# Patient Record
Sex: Male | Born: 1970 | Race: White | Hispanic: No | Smoking: Current every day smoker
Health system: Southern US, Community
[De-identification: ages and names within clinical notes are randomized; demographics above are authoritative.]

## PROBLEM LIST (undated history)

## (undated) DIAGNOSIS — I10 Essential (primary) hypertension: Secondary | ICD-10-CM

## (undated) DIAGNOSIS — K5190 Ulcerative colitis, unspecified, without complications: Secondary | ICD-10-CM

## (undated) DIAGNOSIS — I2510 Atherosclerotic heart disease of native coronary artery without angina pectoris: Secondary | ICD-10-CM

## (undated) DIAGNOSIS — K5010 Crohn's disease of large intestine without complications: Secondary | ICD-10-CM

## (undated) DIAGNOSIS — R569 Unspecified convulsions: Secondary | ICD-10-CM

## (undated) HISTORY — PX: JOINT REPLACEMENT: SHX530

## (undated) HISTORY — PX: APPENDECTOMY: SHX54

## (undated) HISTORY — PX: FRACTURE SURGERY: SHX138

---

## 2021-01-23 ENCOUNTER — Other Ambulatory Visit: Payer: Self-pay

## 2021-01-23 ENCOUNTER — Emergency Department: Payer: Medicare Other

## 2021-01-23 ENCOUNTER — Emergency Department
Admission: EM | Admit: 2021-01-23 | Discharge: 2021-01-24 | Disposition: A | Discharge status: Other Acute Inpatient Hospital | Payer: Medicare Other | Attending: Emergency Medicine | Admitting: Emergency Medicine

## 2021-01-23 DIAGNOSIS — J449 Chronic obstructive pulmonary disease, unspecified: Secondary | ICD-10-CM | POA: Insufficient documentation

## 2021-01-23 DIAGNOSIS — I6200 Nontraumatic subdural hemorrhage, unspecified: Secondary | ICD-10-CM | POA: Insufficient documentation

## 2021-01-23 DIAGNOSIS — S065XAA Traumatic subdural hemorrhage with loss of consciousness status unknown, initial encounter: Secondary | ICD-10-CM

## 2021-01-23 DIAGNOSIS — Z20822 Contact with and (suspected) exposure to covid-19: Secondary | ICD-10-CM | POA: Diagnosis not present

## 2021-01-23 DIAGNOSIS — R569 Unspecified convulsions: Secondary | ICD-10-CM | POA: Insufficient documentation

## 2021-01-23 DIAGNOSIS — F17210 Nicotine dependence, cigarettes, uncomplicated: Secondary | ICD-10-CM | POA: Diagnosis not present

## 2021-01-23 DIAGNOSIS — I10 Essential (primary) hypertension: Secondary | ICD-10-CM | POA: Diagnosis not present

## 2021-01-23 DIAGNOSIS — S065X9A Traumatic subdural hemorrhage with loss of consciousness of unspecified duration, initial encounter: Secondary | ICD-10-CM

## 2021-01-23 LAB — URINALYSIS, COMPLETE (UACMP) WITH MICROSCOPIC
Bacteria, UA: NONE SEEN
Bilirubin Urine: NEGATIVE
Glucose, UA: NEGATIVE mg/dL
Hgb urine dipstick: NEGATIVE
Ketones, ur: NEGATIVE mg/dL
Leukocytes,Ua: NEGATIVE
Nitrite: NEGATIVE
Protein, ur: NEGATIVE mg/dL
Specific Gravity, Urine: 1.008 (ref 1.005–1.030)
Squamous Epithelial / HPF: NONE SEEN (ref 0–5)
pH: 7 (ref 5.0–8.0)

## 2021-01-23 LAB — RESP PANEL BY RT-PCR (FLU A&B, COVID) ARPGX2
Influenza A by PCR: NEGATIVE
Influenza B by PCR: NEGATIVE
SARS Coronavirus 2 by RT PCR: NEGATIVE

## 2021-01-23 LAB — TROPONIN I (HIGH SENSITIVITY): Troponin I (High Sensitivity): 7 ng/L (ref <18)

## 2021-01-23 LAB — CBC WITH DIFFERENTIAL/PLATELET
Abs Immature Granulocytes: 0.04 10*3/uL (ref 0.00–0.07)
Basophils Absolute: 0 10*3/uL (ref 0.0–0.1)
Basophils Relative: 0 %
Eosinophils Absolute: 0.1 10*3/uL (ref 0.0–0.5)
Eosinophils Relative: 1 %
HCT: 47.9 % (ref 39.0–52.0)
Hemoglobin: 16.4 g/dL (ref 13.0–17.0)
Immature Granulocytes: 0 %
Lymphocytes Relative: 30 %
Lymphs Abs: 2.9 10*3/uL (ref 0.7–4.0)
MCH: 31.7 pg (ref 26.0–34.0)
MCHC: 34.2 g/dL (ref 30.0–36.0)
MCV: 92.6 fL (ref 80.0–100.0)
Monocytes Absolute: 0.7 10*3/uL (ref 0.1–1.0)
Monocytes Relative: 8 %
Neutro Abs: 5.8 10*3/uL (ref 1.7–7.7)
Neutrophils Relative %: 61 %
Platelets: 235 10*3/uL (ref 150–400)
RBC: 5.17 MIL/uL (ref 4.22–5.81)
RDW: 12.4 % (ref 11.5–15.5)
WBC: 9.5 10*3/uL (ref 4.0–10.5)
nRBC: 0 % (ref 0.0–0.2)

## 2021-01-23 LAB — COMPREHENSIVE METABOLIC PANEL
ALT: 16 U/L (ref 0–44)
AST: 21 U/L (ref 15–41)
Albumin: 3.8 g/dL (ref 3.5–5.0)
Alkaline Phosphatase: 59 U/L (ref 38–126)
Anion gap: 7 (ref 5–15)
BUN: 14 mg/dL (ref 6–20)
CO2: 26 mmol/L (ref 22–32)
Calcium: 8.6 mg/dL — ABNORMAL LOW (ref 8.9–10.3)
Chloride: 105 mmol/L (ref 98–111)
Creatinine, Ser: 0.91 mg/dL (ref 0.61–1.24)
GFR, Estimated: >60 mL/min (ref >60)
Glucose, Bld: 102 mg/dL — ABNORMAL HIGH (ref 70–99)
Potassium: 4.1 mmol/L (ref 3.5–5.1)
Sodium: 138 mmol/L (ref 135–145)
Total Bilirubin: 2.1 mg/dL — ABNORMAL HIGH (ref 0.3–1.2)
Total Protein: 6.9 g/dL (ref 6.5–8.1)

## 2021-01-23 LAB — PROTIME-INR
INR: 1 (ref 0.8–1.2)
Prothrombin Time: 13.2 seconds (ref 11.4–15.2)

## 2021-01-23 LAB — MAGNESIUM: Magnesium: 2 mg/dL (ref 1.7–2.4)

## 2021-01-23 LAB — APTT: aPTT: 30 seconds (ref 24–36)

## 2021-01-23 MED ORDER — LACTATED RINGERS IV BOLUS
1000.0000 mL | Freq: Once | INTRAVENOUS | Status: AC
  Administered 2021-01-23: 1000 mL via INTRAVENOUS

## 2021-01-23 MED ORDER — NICARDIPINE HCL IN NACL 20-0.86 MG/200ML-% IV SOLN
5.0000 mg/h | INTRAVENOUS | Status: DC
  Administered 2021-01-23: 5 mg/h via INTRAVENOUS
  Filled 2021-01-23: qty 200

## 2021-01-23 MED ORDER — LEVETIRACETAM IN NACL 1500 MG/100ML IV SOLN
1500.0000 mg | Freq: Once | INTRAVENOUS | Status: AC
  Administered 2021-01-23: 1500 mg via INTRAVENOUS
  Filled 2021-01-23: qty 100

## 2021-01-23 MED ORDER — FENTANYL CITRATE (PF) 100 MCG/2ML IJ SOLN
50.0000 ug | Freq: Once | INTRAMUSCULAR | Status: AC
  Administered 2021-01-23: 50 ug via INTRAVENOUS
  Filled 2021-01-23: qty 2

## 2021-01-23 NOTE — ED Notes (Signed)
[facility] to arrange transport to [facility] ED

## 2021-01-23 NOTE — ED Notes (Signed)
Melvin Pearson, powershared images to [employer]

## 2021-01-23 NOTE — ED Provider Notes (Signed)
[facility] Emergency Department Provider Note ____________________________________________   Event Date/Time   First MD Initiated Contact with Patient 01/23/21 2003     (approximate)  I have reviewed the triage vital signs and the nursing notes.  HISTORY  Chief Complaint Seizures (LKW time 1800)   HPI Melvin Pearson is a 50 y.o. malewho presents to the ED for evaluation of new onset seizure.   Chart review indicates hx HTN, HLD, GERD, COPD and continued cigarette smoking.   Patient presents to the ED from home via EMS for evaluation of new onset seizures this evening.  Patient reports an abnormal and severe headache for the past 1 week without precipitating trauma, syncope or any events.  Reports taking some Tylenol and ibuprofen with minimal improvement.  EMS was called to his home due to dysarthria and word finding difficulties this evening, they report negative stroke screen for them, but patient began seizing in front of EMS.  They describe left head and neck rotation, eyes rolled back in the head, left arm twitching.  Lasting 2-3 minutes, aborted with 2.5 mg IV Versed.  Here in the ED, patient reports diffuse global headache that is 10/10 intensity.  Reports this has never happened before.  Denies any falls, trauma or syncopal episodes.  No past medical history on file.  There are no problems to display for this patient.    Prior to Admission medications   Not on File    Allergies Lortab [hydrocodone-acetaminophen] and Morphine and related  No family history on file.  Social History    Review of Systems  Constitutional: No fever/chills Eyes: No visual changes. ENT: No sore throat. Cardiovascular: Denies chest pain. Respiratory: Denies shortness of breath. Gastrointestinal: No abdominal pain.  No nausea, no vomiting.  No diarrhea.  No constipation. Genitourinary: Negative for dysuria. Musculoskeletal: Negative for back pain. Skin:  Negative for rash. Neurological: Negative for focal weakness or numbness. Positive for headache and new onset seizure  ____________________________________________   PHYSICAL EXAM:  VITAL SIGNS: Vitals:   01/23/21 2030 01/23/21 2100  BP: (!) 166/112 (!) 157/106  Pulse: 77 89  Resp: 20 16  Temp:    SpO2: 98% 95%     Constitutional: Alert and oriented. Well appearing and in no acute distress. Eyes: Conjunctivae are normal. PERRL. EOMI. Head: Atraumatic. Nose: No congestion/rhinnorhea. Mouth/Throat: Mucous membranes are moist.  Oropharynx non-erythematous. Neck: No stridor. No cervical spine tenderness to palpation. Cardiovascular: Normal rate, regular rhythm. Grossly normal heart sounds.  Good peripheral circulation. Respiratory: Normal respiratory effort.  No retractions. Lungs CTAB. Gastrointestinal: Soft , nondistended, nontender to palpation. No CVA tenderness. Musculoskeletal: No lower extremity tenderness nor edema.  No joint effusions. No signs of acute trauma. Neurologic:  Normal speech and language. No gross focal neurologic deficits are appreciated.  Cranial nerves II through XII intact 5/5 strength and sensation in all 4 extremities Skin:  Skin is warm, dry and intact. No rash noted. Psychiatric: Mood and affect are normal. Speech and behavior are normal. ____________________________________________   LABS (all labs ordered are listed, but only abnormal results are displayed)  Labs Reviewed  COMPREHENSIVE METABOLIC PANEL - Abnormal; Notable for the following components:      Result Value   Glucose, Bld 102 (*)    Calcium 8.6 (*)    Total Bilirubin 2.1 (*)    All other components within normal limits  RESP PANEL BY RT-PCR (FLU A&B, COVID) ARPGX2  CBC WITH DIFFERENTIAL/PLATELET  MAGNESIUM  PROTIME-INR  APTT  URINALYSIS, COMPLETE (UACMP) WITH MICROSCOPIC  URINE DRUG SCREEN, QUALITATIVE ([facility] ONLY)  TROPONIN I (HIGH SENSITIVITY)    ____________________________________________  12 Lead EKG  Sinus rhythm, rate of 82 bpm.  Normal axis and intervals.  No evidence of acute ischemia. ____________________________________________  RADIOLOGY  ED MD interpretation: CT head reviewed by me with evidence of an acute SDH with 5 mm midline shift.  Official radiology report(s): CT Head Wo Contrast  Result Date: 01/23/2021 CLINICAL DATA:  New onset seizure EXAM: CT HEAD WITHOUT CONTRAST TECHNIQUE: Contiguous axial images were obtained from the base of the skull through the vertex without intravenous contrast. COMPARISON:  None. FINDINGS: Brain: Acute high-density subdural hemorrhage on the right measuring up to 10 mm in thickness. Small amount of subdural hemorrhage along the right tentorium. There is mass-effect on the right cerebral hemisphere with 5 mm midline shift to the left. There is compression of the right lateral ventricle. Negative for hydrocephalus.  Negative for acute infarct or mass. Vascular: Negative for hyperdense vessel Skull: Negative for skull fracture Sinuses/Orbits: Mild mucosal edema paranasal sinuses. Negative orbit Other: None IMPRESSION: Acute subdural hematoma on the right measuring up to 10 mm in thickness. 5 mm midline shift to the left. These results were called by telephone at the time of interpretation on 01/23/2021 at 8:22 pm to provider [facility] , who verbally acknowledged these results. Electronically Signed   By: Marlan Palau M.D.   On: 01/23/2021 20:22   DG Chest Portable 1 View  Result Date: 01/23/2021 CLINICAL DATA:  New onset seizures.  Smoker. EXAM: PORTABLE CHEST 1 VIEW COMPARISON:  None. FINDINGS: The heart size and mediastinal contours are within normal limits. Both lungs are clear. The visualized skeletal structures are unremarkable. IMPRESSION: No active disease. Electronically Signed   By: Burman Nieves M.D.   On: 01/23/2021 20:22     ____________________________________________   PROCEDURES and INTERVENTIONS  Procedure(s) performed (including Critical Care):  .1-3 Lead EKG Interpretation  Date/Time: 01/23/2021 9:06 PM Performed by: Delton Prairie, MD Authorized by: Delton Prairie, MD     Interpretation: normal     ECG rate:  84   ECG rate assessment: normal     Rhythm: sinus rhythm     Ectopy: none     Conduction: normal   .Critical Care  Date/Time: 01/23/2021 9:06 PM Performed by: Delton Prairie, MD Authorized by: Delton Prairie, MD   Critical care provider statement:    Critical care time (minutes):  45   Critical care was necessary to treat or prevent imminent or life-threatening deterioration of the following conditions:  CNS failure or compromise   Critical care was time spent personally by me on the following activities:  Discussions with consultants, evaluation of patient's response to treatment, examination of patient, ordering and performing treatments and interventions, ordering and review of laboratory studies, ordering and review of radiographic studies, pulse oximetry, re-evaluation of patient's condition, obtaining history from patient or surrogate and review of old charts  Medications  nicardipine (CARDENE) 20mg  in 0.86% saline IV infusion (0.1 mg/ml) (5 mg/hr Intravenous New Bag/Given 01/23/21 2038)  lactated ringers bolus 1,000 mL (0 mLs Intravenous Stopped 01/23/21 2111)  levETIRAcetam (KEPPRA) IVPB 1500 mg/ 100 mL premix (0 mg Intravenous Stopped 01/23/21 2111)  fentaNYL (SUBLIMAZE) injection 50 mcg (50 mcg Intravenous Given 01/23/21 2040)    ____________________________________________   MDM / ED COURSE   50 year old male on ASA 50 presents to the ED with seizure-like activity, with evidence of new and acute  subdural hematoma, requiring flight to [facility].  Hypertensive requiring Cardene, otherwise stable.  He presents neurologically intact, GCS 15 without focal deficits, just complaining of  severe headache.  No seizure activity here in the ED.  Rapid CT head obtained and demonstrates acute SDH as the likely source of the symptoms.  Provided Keppra for seizure prophylaxis and initiated Cardene drip for BP control.  Discussed the case with neurosurgery, recommends transfer to [facility] due to no ICU beds available in our hospital.  We will transfer via helicopter.  No known falls or trauma to precipitate this.  No evidence of coagulopathy.  No evidence of ACS.  Clinical Course as of 01/23/21 2112  Thu Jan 23, 2021  2021 I discuss case with Dr. Marcell Barlow who will review images and call me back. [DS]  2029 Updated patient of CT results [DS]  2110 Accepted to [facility]. ED-ED transfer via flight [DS]  2111 Titrating Cardene to 7.5 [DS]    Clinical Course User Index [DS] Delton Prairie, MD    ____________________________________________   FINAL CLINICAL IMPRESSION(S) / ED DIAGNOSES  Final diagnoses:  Seizure (HCC)  Acute subdural hematoma [facility])     ED Discharge Orders     None        Jaiah Weigel   Note:  This document was prepared using Dragon voice recognition software and may include unintentional dictation errors.    Delton Prairie, MD 01/23/21 2114

## 2021-01-23 NOTE — ED Triage Notes (Signed)
BIBA from home for AMS then a witnessed seizure from EMS.  AO4 upon arrival to this facility.  Last well known time is approximately 1800.  Dr. Katrinka Blazing to bedside.    Patient has normal speech, is able to answer all questions appropriately at this time.    Denies SOB, chest pain at this time, does endorse "rapid breathing" during the episode he experienced with the EMS.

## 2021-01-23 NOTE — ED Notes (Signed)
This RN transported patient to CT1 and back.  No acute events during transport.  Alerted Dr. Katrinka Blazing of rad tech suspicions.

## 2021-01-24 LAB — URINE DRUG SCREEN, QUALITATIVE (ARMC ONLY)
Amphetamines, Ur Screen: NOT DETECTED
Barbiturates, Ur Screen: NOT DETECTED
Benzodiazepine, Ur Scrn: POSITIVE — AB
Cannabinoid 50 Ng, Ur ~~LOC~~: POSITIVE — AB
Cocaine Metabolite,Ur ~~LOC~~: NOT DETECTED
MDMA (Ecstasy)Ur Screen: NOT DETECTED
Methadone Scn, Ur: NOT DETECTED
Opiate, Ur Screen: NOT DETECTED
Phencyclidine (PCP) Ur S: NOT DETECTED
Tricyclic, Ur Screen: NOT DETECTED

## 2021-01-29 ENCOUNTER — Other Ambulatory Visit: Payer: Self-pay | Admitting: Neurosurgery

## 2021-01-29 DIAGNOSIS — S065X9A Traumatic subdural hemorrhage with loss of consciousness of unspecified duration, initial encounter: Secondary | ICD-10-CM

## 2021-01-29 DIAGNOSIS — S065XAA Traumatic subdural hemorrhage with loss of consciousness status unknown, initial encounter: Secondary | ICD-10-CM

## 2021-01-31 ENCOUNTER — Other Ambulatory Visit: Payer: Self-pay

## 2021-01-31 ENCOUNTER — Emergency Department: Payer: Medicare Other

## 2021-01-31 ENCOUNTER — Emergency Department
Admission: EM | Admit: 2021-01-31 | Discharge: 2021-01-31 | Disposition: A | Discharge status: Other Acute Inpatient Hospital | Payer: Medicare Other | Attending: Emergency Medicine | Admitting: Emergency Medicine

## 2021-01-31 ENCOUNTER — Ambulatory Visit: Payer: Medicare Other

## 2021-01-31 DIAGNOSIS — R5383 Other fatigue: Secondary | ICD-10-CM | POA: Insufficient documentation

## 2021-01-31 DIAGNOSIS — F17210 Nicotine dependence, cigarettes, uncomplicated: Secondary | ICD-10-CM | POA: Insufficient documentation

## 2021-01-31 DIAGNOSIS — R112 Nausea with vomiting, unspecified: Secondary | ICD-10-CM | POA: Diagnosis not present

## 2021-01-31 DIAGNOSIS — R5381 Other malaise: Secondary | ICD-10-CM | POA: Diagnosis not present

## 2021-01-31 DIAGNOSIS — R42 Dizziness and giddiness: Secondary | ICD-10-CM | POA: Diagnosis not present

## 2021-01-31 DIAGNOSIS — I10 Essential (primary) hypertension: Secondary | ICD-10-CM | POA: Insufficient documentation

## 2021-01-31 DIAGNOSIS — I2510 Atherosclerotic heart disease of native coronary artery without angina pectoris: Secondary | ICD-10-CM | POA: Insufficient documentation

## 2021-01-31 DIAGNOSIS — R262 Difficulty in walking, not elsewhere classified: Secondary | ICD-10-CM | POA: Diagnosis not present

## 2021-01-31 DIAGNOSIS — S065XAA Traumatic subdural hemorrhage with loss of consciousness status unknown, initial encounter: Secondary | ICD-10-CM

## 2021-01-31 DIAGNOSIS — R519 Headache, unspecified: Secondary | ICD-10-CM | POA: Diagnosis present

## 2021-01-31 DIAGNOSIS — S065X9A Traumatic subdural hemorrhage with loss of consciousness of unspecified duration, initial encounter: Secondary | ICD-10-CM

## 2021-01-31 DIAGNOSIS — Z20822 Contact with and (suspected) exposure to covid-19: Secondary | ICD-10-CM | POA: Diagnosis not present

## 2021-01-31 DIAGNOSIS — R410 Disorientation, unspecified: Secondary | ICD-10-CM | POA: Insufficient documentation

## 2021-01-31 HISTORY — DX: Atherosclerotic heart disease of native coronary artery without angina pectoris: I25.10

## 2021-01-31 HISTORY — DX: Essential (primary) hypertension: I10

## 2021-01-31 HISTORY — DX: Ulcerative colitis, unspecified, without complications: K51.90

## 2021-01-31 HISTORY — DX: Unspecified convulsions: R56.9

## 2021-01-31 HISTORY — DX: Crohn's disease of large intestine without complications: K50.10

## 2021-01-31 LAB — CBC
HCT: 51.6 % (ref 39.0–52.0)
Hemoglobin: 18.1 g/dL — ABNORMAL HIGH (ref 13.0–17.0)
MCH: 32.2 pg (ref 26.0–34.0)
MCHC: 35.1 g/dL (ref 30.0–36.0)
MCV: 91.8 fL (ref 80.0–100.0)
Platelets: 298 10*3/uL (ref 150–400)
RBC: 5.62 MIL/uL (ref 4.22–5.81)
RDW: 11.9 % (ref 11.5–15.5)
WBC: 15 10*3/uL — ABNORMAL HIGH (ref 4.0–10.5)
nRBC: 0 % (ref 0.0–0.2)

## 2021-01-31 LAB — PROTIME-INR
INR: 1 (ref 0.8–1.2)
Prothrombin Time: 12.8 seconds (ref 11.4–15.2)

## 2021-01-31 LAB — BASIC METABOLIC PANEL
Anion gap: 8 (ref 5–15)
BUN: 16 mg/dL (ref 6–20)
CO2: 29 mmol/L (ref 22–32)
Calcium: 10.1 mg/dL (ref 8.9–10.3)
Chloride: 95 mmol/L — ABNORMAL LOW (ref 98–111)
Creatinine, Ser: 0.83 mg/dL (ref 0.61–1.24)
GFR, Estimated: >60 mL/min (ref >60)
Glucose, Bld: 124 mg/dL — ABNORMAL HIGH (ref 70–99)
Potassium: 4.4 mmol/L (ref 3.5–5.1)
Sodium: 132 mmol/L — ABNORMAL LOW (ref 135–145)

## 2021-01-31 LAB — TYPE AND SCREEN
ABO/RH(D): O POS
Antibody Screen: NEGATIVE

## 2021-01-31 LAB — CBG MONITORING, ED: Glucose-Capillary: 133 mg/dL — ABNORMAL HIGH (ref 70–99)

## 2021-01-31 LAB — RESP PANEL BY RT-PCR (FLU A&B, COVID) ARPGX2
Influenza A by PCR: NEGATIVE
Influenza B by PCR: NEGATIVE
SARS Coronavirus 2 by RT PCR: NEGATIVE

## 2021-01-31 MED ORDER — LEVETIRACETAM IN NACL 1000 MG/100ML IV SOLN
1000.0000 mg | Freq: Once | INTRAVENOUS | Status: AC
  Administered 2021-01-31: 1000 mg via INTRAVENOUS
  Filled 2021-01-31: qty 100

## 2021-01-31 MED ORDER — PANTOPRAZOLE SODIUM 40 MG IV SOLR
40.0000 mg | Freq: Once | INTRAVENOUS | Status: AC
  Administered 2021-01-31: 40 mg via INTRAVENOUS
  Filled 2021-01-31: qty 40

## 2021-01-31 MED ORDER — ONDANSETRON HCL 4 MG/2ML IJ SOLN
4.0000 mg | Freq: Once | INTRAMUSCULAR | Status: AC
  Administered 2021-01-31: 4 mg via INTRAVENOUS
  Filled 2021-01-31: qty 2

## 2021-01-31 MED ORDER — FENTANYL CITRATE (PF) 100 MCG/2ML IJ SOLN
50.0000 ug | Freq: Once | INTRAMUSCULAR | Status: AC
  Administered 2021-01-31: 50 ug via INTRAVENOUS
  Filled 2021-01-31: qty 2

## 2021-01-31 NOTE — ED Provider Notes (Signed)
[facility] Emergency Department Provider Note  ____________________________________________   Event Date/Time   First MD Initiated Contact with Patient 01/31/21 1139     (approximate)  I have reviewed the triage vital signs and the nursing notes.   HISTORY  Chief Complaint Headache and Dizziness    HPI Melvin Pearson is a 50 y.o. male with history of recent admission for subdural here with worsening headache, nausea, dizziness.  The patient states that ever since he was hospitalized and admitted to, the patient has had ongoing headaches.  Over the last week, this has been worsening, and he has had worsening nausea, fatigue, and general malaise.  He has had nausea and vomiting.  He said difficulty walking.  Patient states that headache is 10 out of 10, generalized.  He has had some confusion.  Denies recent falls.  No other complaints.  Denies blood thinner use.  He stopped his aspirin.    Past Medical History:  Diagnosis Date  . Coronary artery disease   . Crohn's colitis (HCC)   . Hypertension   . Seizures (HCC)   . Ulcerative colitis (HCC)     There are no problems to display for this patient.   Past Surgical History:  Procedure Laterality Date  . APPENDECTOMY    . FRACTURE SURGERY    . JOINT REPLACEMENT      Prior to Admission medications   Not on File    Allergies Lortab [hydrocodone-acetaminophen] and Morphine and related  No family history on file.  Social History Social History   Tobacco Use  . Smoking status: Every Day    Pack years: 0.00    Types: Cigarettes  . Smokeless tobacco: Never  Substance Use Topics  . Alcohol use: Not Currently  . Drug use: Yes    Types: Marijuana    Review of Systems  Review of Systems  Constitutional:  Positive for fatigue. Negative for chills and fever.  HENT:  Negative for sore throat.   Respiratory:  Negative for shortness of breath.   Cardiovascular:  Negative for chest pain.   Gastrointestinal:  Negative for abdominal pain.  Genitourinary:  Negative for flank pain.  Musculoskeletal:  Negative for neck pain.  Skin:  Negative for rash and wound.  Allergic/Immunologic: Negative for immunocompromised state.  Neurological:  Positive for headaches. Negative for weakness and numbness.  Hematological:  Does not bruise/bleed easily.  All other systems reviewed and are negative.   ____________________________________________  PHYSICAL EXAM:      VITAL SIGNS: ED Triage Vitals  Enc Vitals Group     BP 01/31/21 1116 (!) 140/91     Pulse Rate 01/31/21 1116 65     Resp 01/31/21 1116 18     Temp 01/31/21 1116 98.6 F (37 C)     Temp src --      SpO2 01/31/21 1116 98 %     Weight 01/31/21 1116 224 lb (101.6 kg)     Height 01/31/21 1116 6\' 3"  (1.905 m)     Head Circumference --      Peak Flow --      Pain Score 01/31/21 1121 10     Pain Loc --      Pain Edu? --      Excl. in GC? --      Physical Exam Vitals and nursing note reviewed.  Constitutional:      General: He is not in acute distress.    Appearance: He is well-developed.  HENT:  Head: Normocephalic and atraumatic.  Eyes:     Conjunctiva/sclera: Conjunctivae normal.  Cardiovascular:     Rate and Rhythm: Normal rate and regular rhythm.     Heart sounds: Normal heart sounds. No murmur heard.   No friction rub.  Pulmonary:     Effort: Pulmonary effort is normal. No respiratory distress.     Breath sounds: Normal breath sounds. No wheezing or rales.  Abdominal:     General: There is no distension.     Palpations: Abdomen is soft.     Tenderness: There is no abdominal tenderness.  Musculoskeletal:     Cervical back: Neck supple.  Skin:    General: Skin is warm.     Capillary Refill: Capillary refill takes less than 2 seconds.  Neurological:     Mental Status: He is alert and oriented to person, place, and time.     Motor: No abnormal muscle tone.     Comments: Neurological Exam:  Mental  Status: Alert and oriented to person, place, and time. Attention and concentration normal. Speech clear. Recent memory is intact. Cranial Nerves: Visual fields grossly intact. EOMI and PERRLA. No nystagmus noted. Facial sensation intact at forehead, maxillary cheek, and chin/mandible bilaterally. No facial asymmetry or weakness. Hearing grossly normal. Uvula is midline, and palate elevates symmetrically. Normal SCM and trapezius strength. Tongue midline without fasciculations. Motor: Muscle strength 5/5 in proximal and distal UE and LE bilaterally. No pronator drift. Muscle tone normal. Sensation: Intact to light touch in upper and lower extremities distally bilaterally.  Gait: Normal without ataxia. Coordination: Normal FTN bilaterally.          ____________________________________________   LABS (all labs ordered are listed, but only abnormal results are displayed)  Labs Reviewed  BASIC METABOLIC PANEL - Abnormal; Notable for the following components:      Result Value   Sodium 132 (*)    Chloride 95 (*)    Glucose, Bld 124 (*)    All other components within normal limits  CBC - Abnormal; Notable for the following components:   WBC 15.0 (*)    Hemoglobin 18.1 (*)    All other components within normal limits  CBG MONITORING, ED - Abnormal; Notable for the following components:   Glucose-Capillary 133 (*)    All other components within normal limits  URINALYSIS, COMPLETE (UACMP) WITH MICROSCOPIC    ____________________________________________  EKG: Normal sinus rhythm, ventricular rate 57.  PR 138, QRS 82, QTc 399.  No acute ST elevations or depressions.  No EKG evidence of acute ischemia or infarct. ________________________________________  RADIOLOGY All imaging, including plain films, CT scans, and ultrasounds, independently reviewed by me, and interpretations confirmed via formal radiology reads.  ED MD interpretation:   CT head: Mild interval increase in size of the  right-sided subdural  Official radiology report(s): No results found.  ____________________________________________  PROCEDURES   Procedure(s) performed (including Critical Care):  .Critical Care  Date/Time: 01/31/2021 1:48 PM Performed by: Shaune Pollack, MD Authorized by: Shaune Pollack, MD   Critical care provider statement:    Critical care time (minutes):  35   Critical care time was exclusive of:  Separately billable procedures and treating other patients and teaching time   Critical care was necessary to treat or prevent imminent or life-threatening deterioration of the following conditions:  Trauma, circulatory failure and cardiac failure   Critical care was time spent personally by me on the following activities:  Development of treatment plan with patient or surrogate, discussions  with consultants, evaluation of patient's response to treatment, examination of patient, obtaining history from patient or surrogate, ordering and performing treatments and interventions, ordering and review of laboratory studies, ordering and review of radiographic studies, pulse oximetry, re-evaluation of patient's condition and review of old charts   I assumed direction of critical care for this patient from another provider in my specialty: no    ____________________________________________  INITIAL IMPRESSION / MDM / ASSESSMENT AND PLAN / ED COURSE  As part of my medical decision making, I reviewed the following data within the electronic MEDICAL RECORD NUMBER Nursing notes reviewed and incorporated, Old chart reviewed, Notes from prior ED visits, and [place] Controlled Substance Database       *Melvin Pearson was evaluated in Emergency Department on 01/31/2021 for the symptoms described in the history of present illness. He was evaluated in the context of the global COVID-19 pandemic, which necessitated consideration that the patient might be at risk for infection with the SARS-CoV-2 virus that causes  COVID-19. Institutional protocols and algorithms that pertain to the evaluation of patients at risk for COVID-19 are in a state of rapid change based on information released by regulatory bodies including the CDC and federal and state organizations. These policies and algorithms were followed during the patient's care in the ED.  Some ED evaluations and interventions may be delayed as a result of limited staffing during the pandemic.*     Medical Decision Making: 50 year old male here with ongoing, worsening headache, dizziness, in setting of recent subdural.  CT head obtained on arrival, reviewed by me, and is concerning for worsening hemorrhage with an acute component.  There is 7 mm as opposed to 5 mm midline shift.  He is neurologically at baseline.  He is on blood thinners.  Blood pressure is at goal.  Case discussed with Dr. Marcell Barlow, will transfer urgently to [facility] for operative intervention.  Patient updated and in agreement.  He is NPO.  Coags normal.  CBC without anemia or platelet abnormality.  Electrolytes largely normal.  ____________________________________________  FINAL CLINICAL IMPRESSION(S) / ED DIAGNOSES  Final diagnoses:  None     MEDICATIONS GIVEN DURING THIS VISIT:  Medications - No data to display   ED Discharge Orders     None        Note:  This document was prepared using Dragon voice recognition software and may include unintentional dictation errors.   Shaune Pollack, MD 01/31/21 1348

## 2021-01-31 NOTE — ED Triage Notes (Signed)
Pt c/o HA for the past 2 weeks and having dizziness over the past week, pt was seen here 6/16 with seizure with no prior hx.. pt is a/ox4. Denies any recent injury.Marland Kitchen

## 2021-01-31 NOTE — ED Notes (Signed)
EMTALA reviewed by this RN.  

## 2021-02-03 ENCOUNTER — Ambulatory Visit: Payer: Medicare Other

## 2022-06-21 IMAGING — DX DG CHEST 1V PORT
1 series · 1 of 1 positions shown · non-contrast
Comparison: None.

CLINICAL DATA: New onset seizures.  Smoker.

EXAM:
PORTABLE CHEST 1 VIEW

[chest ap]
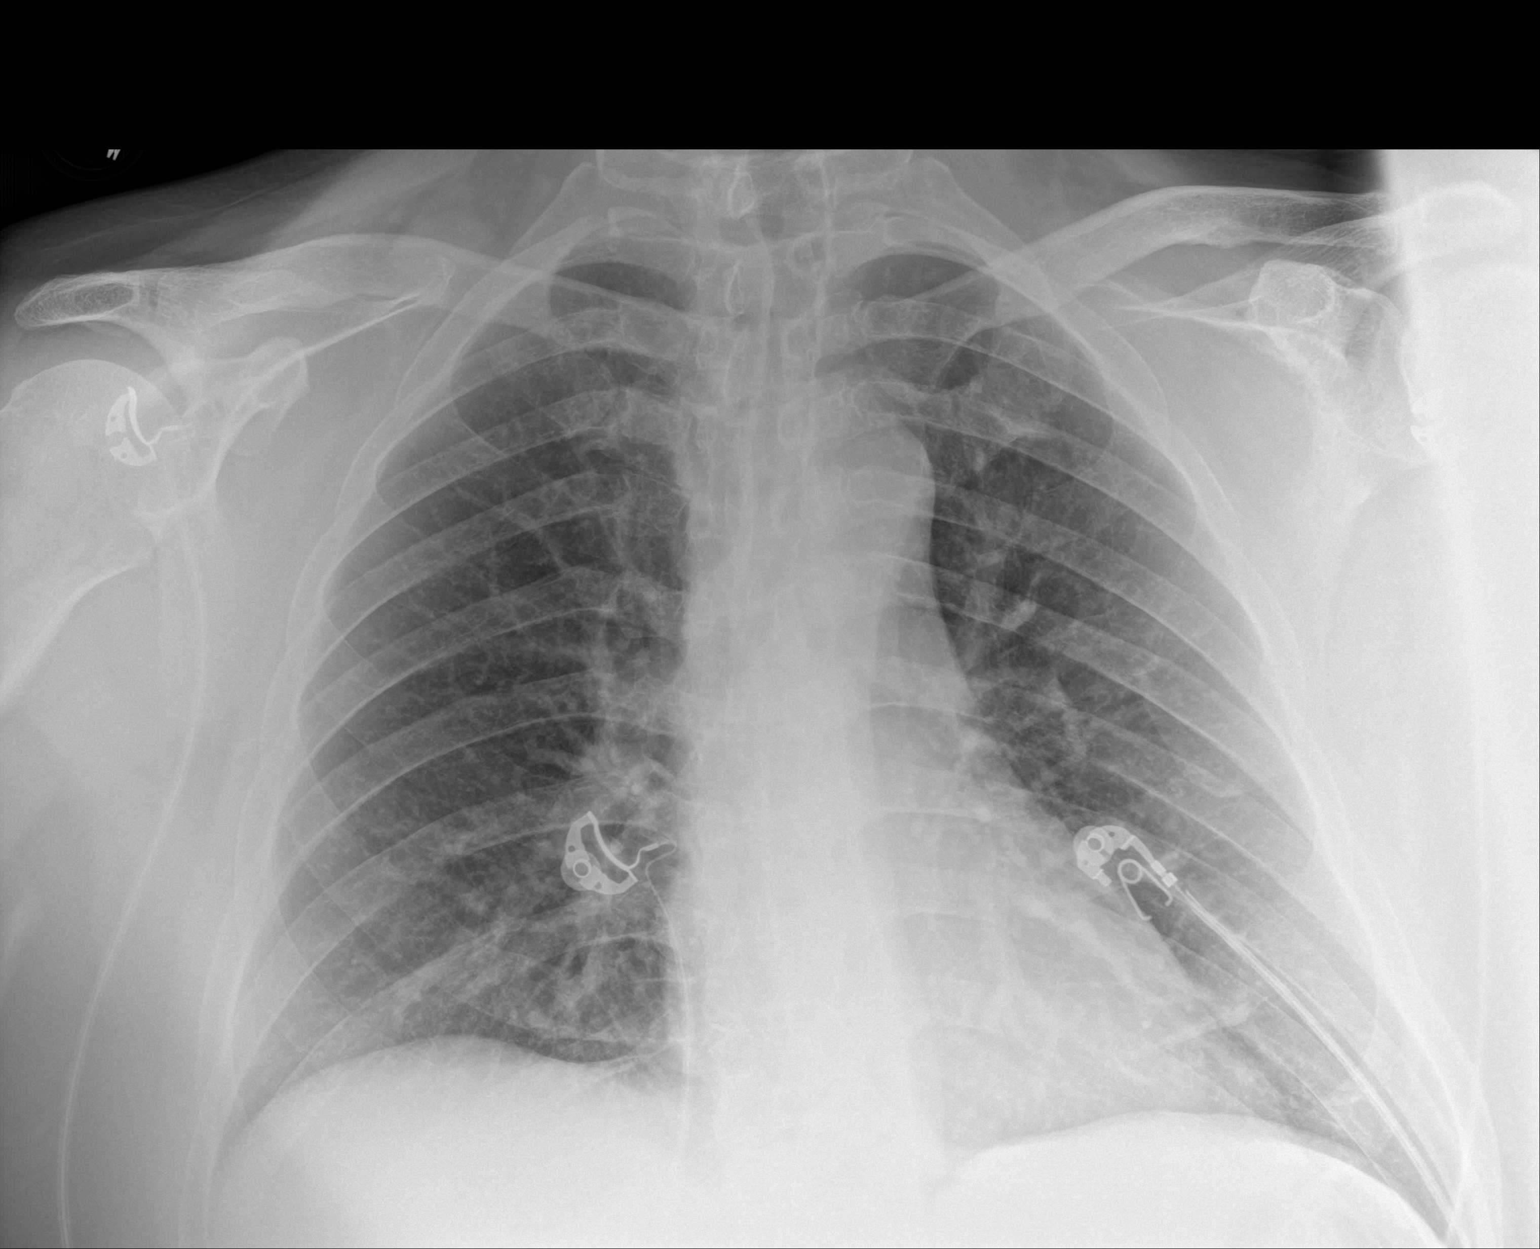

[1 of 1 positions shown; findings below may reference images not displayed]

FINDINGS: The heart size and mediastinal contours are within normal limits.
Both lungs are clear. The visualized skeletal structures are
unremarkable.
IMPRESSION: No active disease.

## 2022-06-21 IMAGING — CT CT HEAD W/O CM
3 of 4 series · 15 of 47 positions shown, 18 images · non-contrast
Comparison: None.

CLINICAL DATA: New onset seizure

EXAM:
CT HEAD WITHOUT CONTRAST
TECHNIQUE: Contiguous axial images were obtained from the base of the skull
through the vertex without intravenous contrast.

[Series 2: head wo · axial · 0.46mm/px · z∈[-153,-28]mm · 9 of 31 slices shown, 12 images]
[im 3/31  brain]
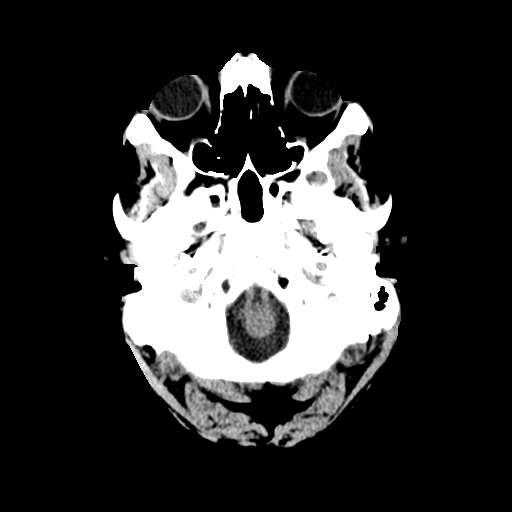
[im 3/31  bone]
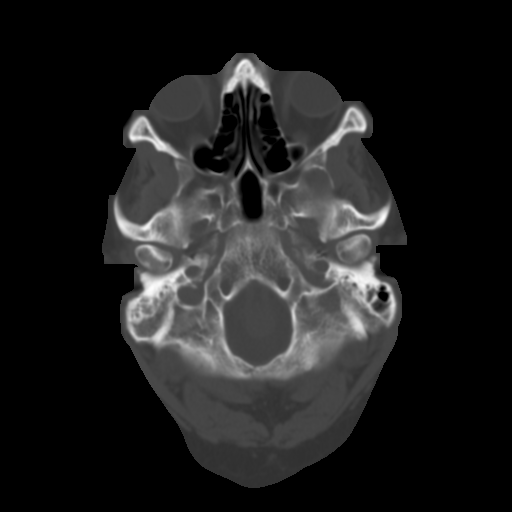
[im 7/31  brain]
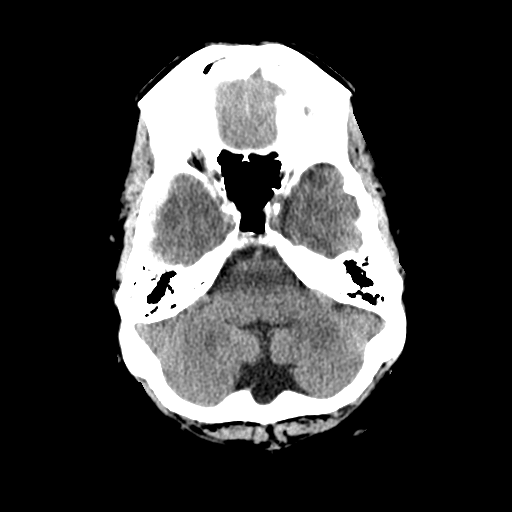
[im 9/31  brain]
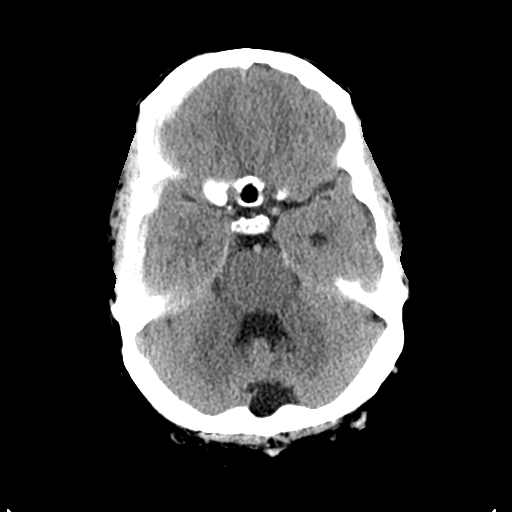
[im 13/31  brain]
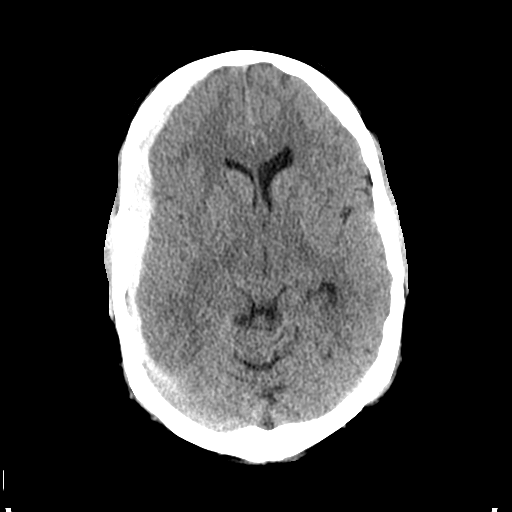
[im 16/31  brain]
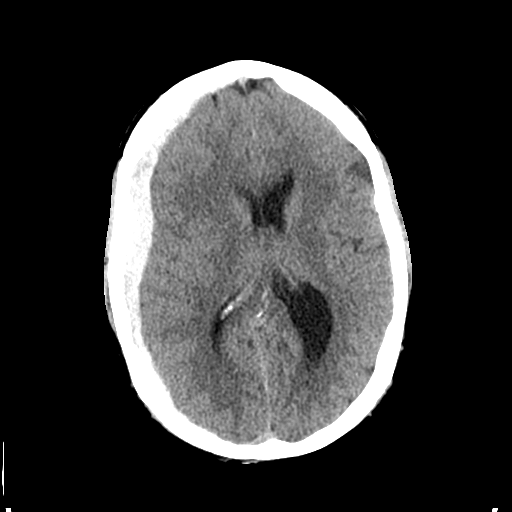
[im 16/31  bone]
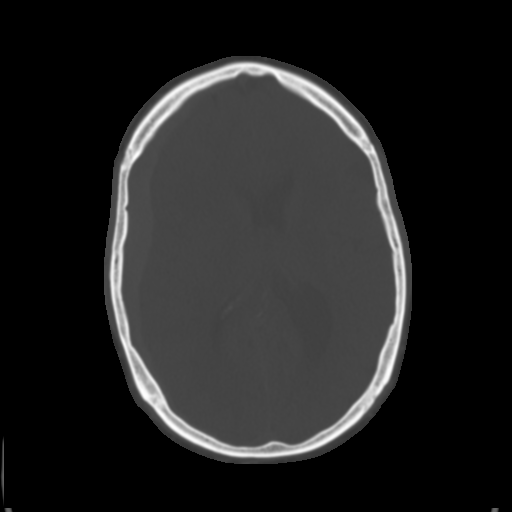
[im 18/31  brain]
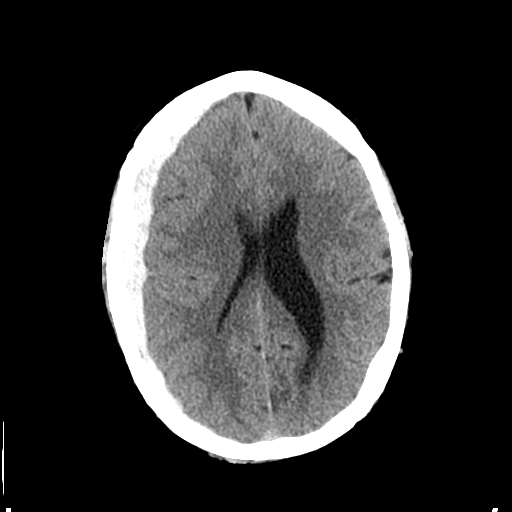
[im 22/31  brain]
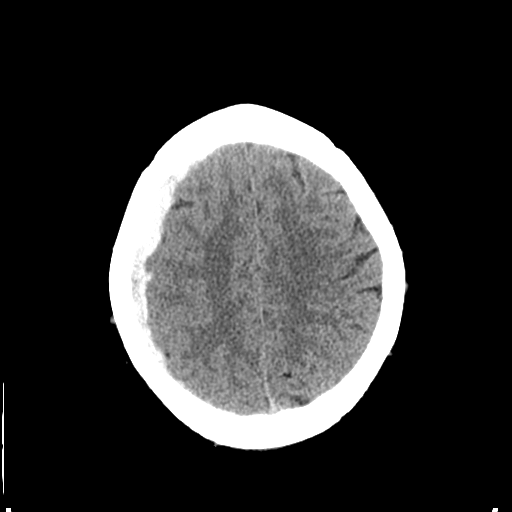
[im 24/31  brain]
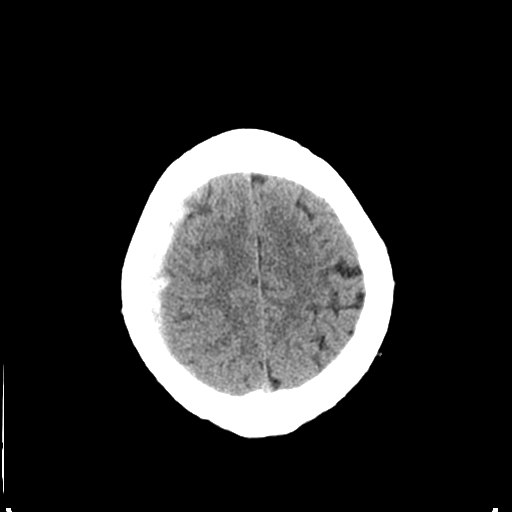
[im 28/31  brain]
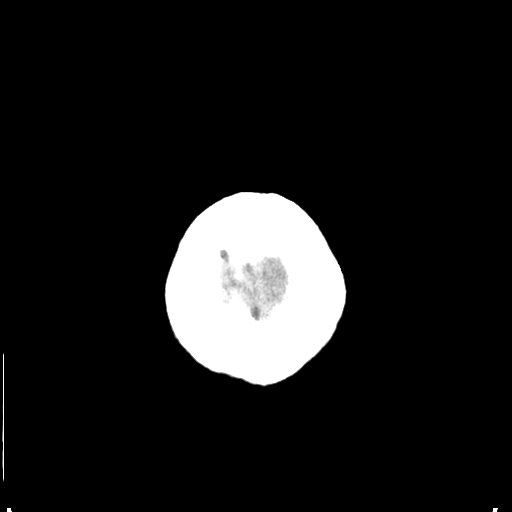
[im 28/31  bone]
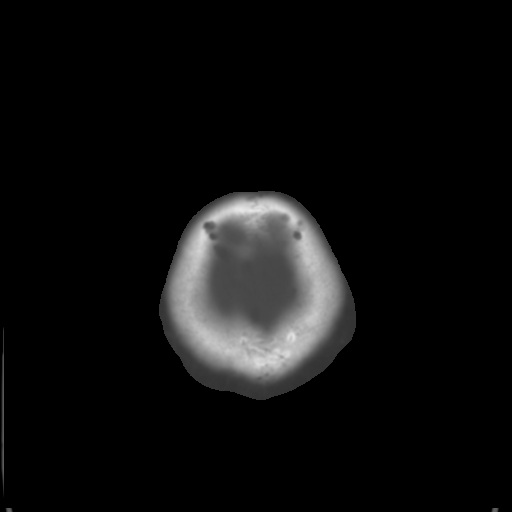

[Series 4: coronal soft tissue · coronal · 0.33mm/px · 3 of 64 slices shown]
[im 22/64  brain]
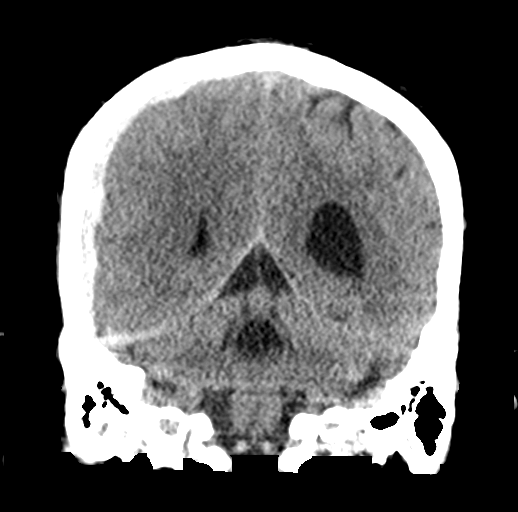
[im 29/64  brain]
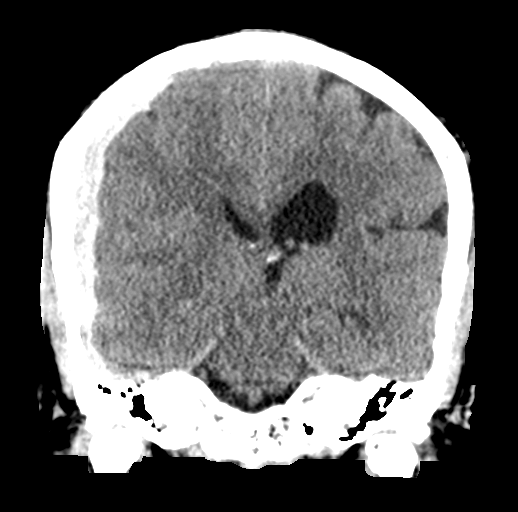
[im 36/64  brain]
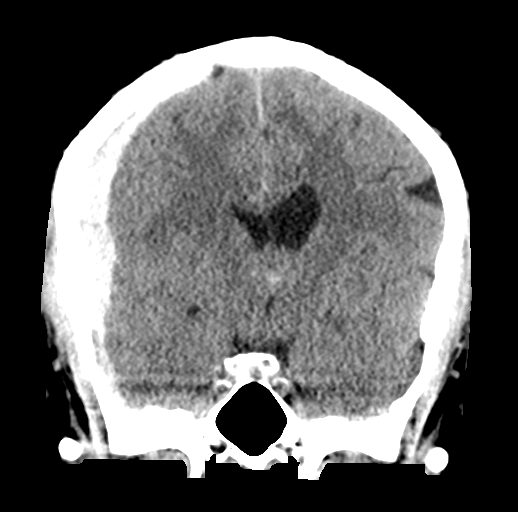

[Series 5: sagittal soft tissue · sagittal · 0.33mm/px · 3 of 54 slices shown]
[im 18/54  brain]
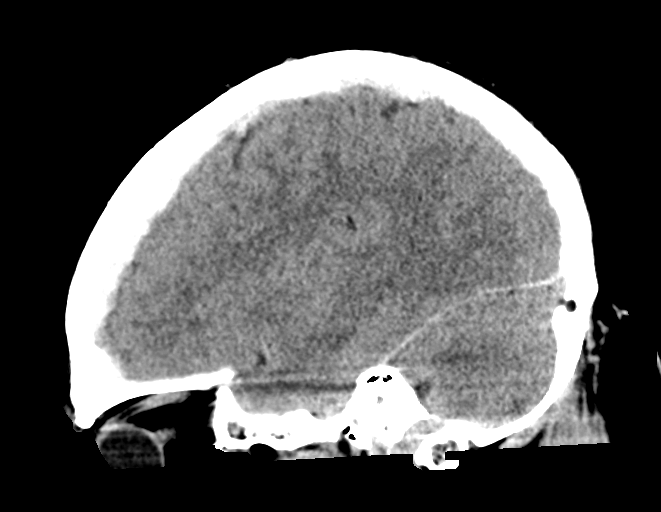
[im 27/54  brain]
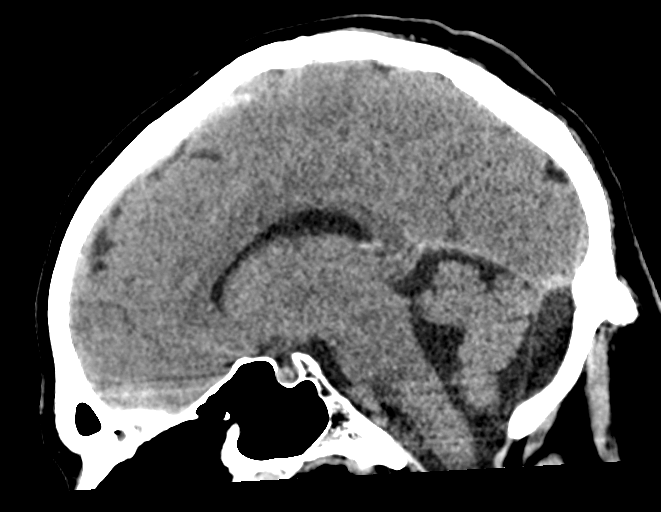
[im 36/54  brain]
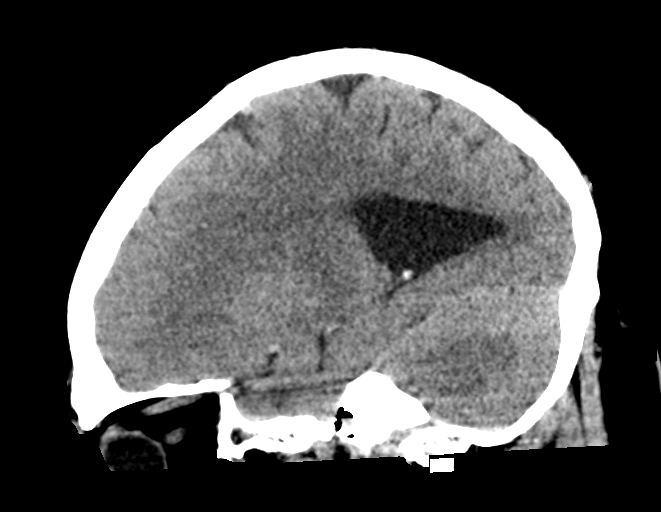

[15 of 47 positions shown; findings below may reference images not displayed]

FINDINGS: Brain: Acute high-density subdural hemorrhage on the right measuring
up to 10 mm in thickness. Small amount of subdural hemorrhage along
the right tentorium. There is mass-effect on the right cerebral
hemisphere with 5 mm midline shift to the left. There is compression
of the right lateral ventricle.

Negative for hydrocephalus.  Negative for acute infarct or mass.

Vascular: Negative for hyperdense vessel

Skull: Negative for skull fracture

Sinuses/Orbits: Mild mucosal edema paranasal sinuses. Negative orbit

Other: None
IMPRESSION: Acute subdural hematoma on the right measuring up to 10 mm in
thickness. 5 mm midline shift to the left.

These results were called by telephone at the time of interpretation
on 01/23/2021 at [DATE] to provider ROOSTER LOMELI , who verbally
acknowledged these results.
